# Patient Record
Sex: Male | Born: 1990 | Race: White | Hispanic: No | Marital: Married | Smoking: Never smoker
Health system: Southern US, Community
[De-identification: ages and names within clinical notes are randomized; demographics above are authoritative.]

## PROBLEM LIST (undated history)

## (undated) DIAGNOSIS — F418 Other specified anxiety disorders: Secondary | ICD-10-CM

## (undated) DIAGNOSIS — G43909 Migraine, unspecified, not intractable, without status migrainosus: Secondary | ICD-10-CM

## (undated) HISTORY — DX: Migraine, unspecified, not intractable, without status migrainosus: G43.909

## (undated) HISTORY — DX: Other specified anxiety disorders: F41.8

## (undated) HISTORY — PX: CARDIAC SURGERY: SHX584

---
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
## 2012-08-21 ENCOUNTER — Encounter: Payer: Self-pay | Admitting: Nurse Practitioner

## 2012-08-28 ENCOUNTER — Ambulatory Visit: Payer: Self-pay | Admitting: Nurse Practitioner

## 2013-08-17 ENCOUNTER — Emergency Department (HOSPITAL_COMMUNITY): Admission: EM | Admit: 2013-08-17 | Payer: Self-pay | Source: Home / Self Care

## 2013-08-17 ENCOUNTER — Emergency Department: Payer: Self-pay | Admitting: Emergency Medicine

## 2019-07-23 ENCOUNTER — Other Ambulatory Visit: Payer: Self-pay

## 2019-07-23 ENCOUNTER — Ambulatory Visit: Payer: Medicaid Other | Admitting: *Deleted

## 2019-07-23 DIAGNOSIS — Z20822 Contact with and (suspected) exposure to covid-19: Secondary | ICD-10-CM

## 2019-07-23 LAB — POC COVID19 BINAXNOW: SARS Coronavirus 2 Ag: NEGATIVE

## 2019-12-31 ENCOUNTER — Telehealth: Payer: Medicaid Other | Admitting: Medical

## 2019-12-31 ENCOUNTER — Ambulatory Visit: Payer: Medicaid Other | Admitting: *Deleted

## 2019-12-31 ENCOUNTER — Other Ambulatory Visit: Payer: Self-pay

## 2019-12-31 ENCOUNTER — Encounter: Payer: Self-pay | Admitting: Medical

## 2019-12-31 DIAGNOSIS — Z20822 Contact with and (suspected) exposure to covid-19: Secondary | ICD-10-CM

## 2019-12-31 NOTE — Patient Instructions (Signed)

## 2019-12-31 NOTE — Progress Notes (Signed)
   Subjective:    Patient ID: Peter Aguirre, male    DOB: 23-Jan-1991, 29 y.o.   MRN: [id number]  HPI 29 yo male with non acute distress consents to telemedicine appointment.  Close contact Nikki at work last exposure was on  last Wednesday 12/25/2019.  Currently no symptoms. Reviwed all symptoms of Covid-19 like fever chills, abdominal pain, loss of tast or smell, diarrhea, body aches , headaches, etc. He denies all symptoms.   Unvacciated.    Allergies  Allergen Reactions  . Sulfa Antibiotics     Review of Systems  Constitutional: Negative.   HENT: Negative.   Eyes: Negative.   Respiratory: Negative.   Cardiovascular: Negative.   Gastrointestinal: Negative.   Endocrine: Negative.   Musculoskeletal: Negative.   Skin: Negative.   Neurological: Negative.   Hematological: Negative.         Objective:   Physical Exam  AXOX3 No physical exam preformed due to  Telemedicine appointment.   Covid-19 PCR pending.  Assessment & Plan:  Close contact , asymptomatic , unvaccinated  PCR Covid-19 Pending. Patient to isolate until test results are received.  I will call patient and review results and his plan of action. He verbalizes understanding and has no questions at the end of discharge.

## 2020-01-01 LAB — NOVEL CORONAVIRUS, NAA: SARS-CoV-2, NAA: NOT DETECTED

## 2020-01-01 LAB — SARS-COV-2, NAA 2 DAY TAT

## 2020-01-07 ENCOUNTER — Telehealth: Payer: Self-pay | Admitting: Medical

## 2020-01-07 NOTE — Telephone Encounter (Signed)
Reviewed Negative PCR Covid-19 test results with patient. He may return  To work he needs to wear a mask  X 14 and if any symptoms occur to call the [facility] for repeat testing. S/S reviewed with patient. He verbalizes understanding and has no questions at the end of our conversation.

## 2020-05-06 ENCOUNTER — Other Ambulatory Visit: Payer: Self-pay

## 2020-05-06 ENCOUNTER — Telehealth: Payer: Medicaid Other | Admitting: Nurse Practitioner

## 2020-05-06 ENCOUNTER — Ambulatory Visit: Payer: Medicaid Other

## 2020-05-06 DIAGNOSIS — Z20822 Contact with and (suspected) exposure to covid-19: Secondary | ICD-10-CM

## 2020-05-06 DIAGNOSIS — U071 COVID-19: Secondary | ICD-10-CM

## 2020-05-06 LAB — POC COVID19 BINAXNOW: SARS Coronavirus 2 Ag: POSITIVE — AB

## 2020-05-06 NOTE — Progress Notes (Signed)
poc

## 2020-05-06 NOTE — Progress Notes (Signed)
   Subjective:    Patient ID: Peter Aguirre, male    DOB: [birth year], 30 y.o.   MRN: [id number]  HPI   30 year old male presenting to [facility] for COVID testing today based on symptoms of HA, chills, fever.   Symptoms started 05/04/20.   Patient has not been vaccinated for COVID-19   Patient consents to telehealth appointment.    Review of Systems  Constitutional: Positive for chills and fever.  HENT: Positive for congestion.   Respiratory: Positive for cough.   Gastrointestinal: Negative.   Genitourinary: Negative.   Musculoskeletal: Positive for myalgias.  Neurological: Positive for headaches.   Past Medical History:  Diagnosis Date  . Depression with anxiety   . Migraine     Current Outpatient Medications  Medication Instructions  . acetaminophen (TYLENOL) 650 mg, Every 6 hours PRN  . Cetirizine HCl 10 MG CAPS As directed  . nortriptyline (PAMELOR) 10 mg, 3 times daily      Objective:   Physical Exam  This was a telehealth appointment with patient after nurse visit for COVID-19 testing  Patient was in no acute distress during phone conversation with provider   Recent Results (from the past 2160 hour(s))  POC COVID-19     Status: Abnormal   Collection Time: 05/06/20  2:02 PM  Result Value Ref Range   SARS Coronavirus 2 Ag Positive (A) Negative    Comment: not vaccinated, symptomatic. Aware of positive POC test. Has phone appt with Lavone Neri FNP for further eval.      Assessment & Plan:  Discussed with patient options for anti-viral or monoclonal infusions through [facility] for patients that are not vaccinated, patient would like referral. Explained a referral does not guarantee treatment and that a call will be for consultation  Discussed using OTC medications for symptom support (decongestant/tylenol/advil) Discussed importance of maintaining hydration and adequate caloric intake.   Follow up with [facility] if symptoms persist or with new concerns Seek immediate  medical attention for acutely worsening symptoms

## 2020-05-06 NOTE — Patient Instructions (Signed)

## 2020-05-07 ENCOUNTER — Telehealth: Payer: Self-pay | Admitting: Infectious Diseases

## 2020-05-07 NOTE — Telephone Encounter (Signed)
Called to discuss with patient about COVID-19 symptoms and the use of one of the available treatments for those with mild to moderate Covid symptoms and at a high risk of hospitalization.  Pt appears to qualify for outpatient treatment due to co-morbid conditions and/or a member of an at-risk group in accordance with the FDA Emergency Use Authorization.    Symptom onset: 05/04/2020 Vaccinated: not documented  Booster?  Immunocompromised? no Qualifiers: unknown  Unable to reach pt - unable to LVM. Sent Mychart message.  Would probably be an acceptable molnupiravir candidate vs symptomatic care if we can get him on the phone today.   Rexene Alberts
